# Patient Record
Sex: Male | Born: 1950 | Race: Black or African American | Hispanic: No | Marital: Married
Health system: Southern US, Community
[De-identification: ages and names within clinical notes are randomized; demographics above are authoritative.]

---

## 2012-03-26 ENCOUNTER — Inpatient Hospital Stay: Payer: Self-pay | Admitting: Internal Medicine

## 2012-03-26 LAB — COMPREHENSIVE METABOLIC PANEL
Albumin: 2.4 g/dL — ABNORMAL LOW (ref 3.4–5.0)
Alkaline Phosphatase: 193 U/L — ABNORMAL HIGH (ref 50–136)
BUN: 12 mg/dL (ref 7–18)
Bilirubin,Total: 1.6 mg/dL — ABNORMAL HIGH (ref 0.2–1.0)
Co2: 20 mmol/L — ABNORMAL LOW (ref 21–32)
Creatinine: 0.62 mg/dL (ref 0.60–1.30)
EGFR (Non-African Amer.): >60
Osmolality: 272 (ref 275–301)
SGPT (ALT): 86 U/L — ABNORMAL HIGH (ref 12–78)
Sodium: 135 mmol/L — ABNORMAL LOW (ref 136–145)
Total Protein: 5.6 g/dL — ABNORMAL LOW (ref 6.4–8.2)

## 2012-03-26 LAB — CBC
HCT: 30.5 % — ABNORMAL LOW (ref 40.0–52.0)
MCH: 32 pg (ref 26.0–34.0)
MCHC: 31.9 g/dL — ABNORMAL LOW (ref 32.0–36.0)
MCV: 101 fL — ABNORMAL HIGH (ref 80–100)
RBC: 3.03 10*6/uL — ABNORMAL LOW (ref 4.40–5.90)
RDW: 19.2 % — ABNORMAL HIGH (ref 11.5–14.5)
WBC: 10.1 10*3/uL (ref 3.8–10.6)

## 2012-03-26 LAB — PROTIME-INR
INR: 1
Prothrombin Time: 13.5 secs (ref 11.5–14.7)

## 2012-03-26 LAB — LIPASE, BLOOD: Lipase: 47 U/L — ABNORMAL LOW (ref 73–393)

## 2012-03-26 LAB — TROPONIN I: Troponin-I: <0.02 ng/mL

## 2012-03-27 LAB — COMPREHENSIVE METABOLIC PANEL
Albumin: 2 g/dL — ABNORMAL LOW (ref 3.4–5.0)
Alkaline Phosphatase: 159 U/L — ABNORMAL HIGH (ref 50–136)
BUN: 12 mg/dL (ref 7–18)
Bilirubin,Total: 2 mg/dL — ABNORMAL HIGH (ref 0.2–1.0)
Calcium, Total: 7.2 mg/dL — ABNORMAL LOW (ref 8.5–10.1)
Co2: 25 mmol/L (ref 21–32)
Creatinine: 0.7 mg/dL (ref 0.60–1.30)
EGFR (Non-African Amer.): >60
Glucose: 124 mg/dL — ABNORMAL HIGH (ref 65–99)
Osmolality: 281 (ref 275–301)
Potassium: 3.6 mmol/L (ref 3.5–5.1)
SGPT (ALT): 66 U/L (ref 12–78)
Sodium: 140 mmol/L (ref 136–145)
Total Protein: 4.6 g/dL — ABNORMAL LOW (ref 6.4–8.2)

## 2012-03-27 LAB — CBC WITH DIFFERENTIAL/PLATELET
Basophil #: 0 10*3/uL (ref 0.0–0.1)
Eosinophil #: 0.1 10*3/uL (ref 0.0–0.7)
Eosinophil %: 1.3 %
Lymphocyte #: 1 10*3/uL (ref 1.0–3.6)
MCH: 32 pg (ref 26.0–34.0)
MCHC: 31.8 g/dL — ABNORMAL LOW (ref 32.0–36.0)
MCV: 101 fL — ABNORMAL HIGH (ref 80–100)
Monocyte #: 0.8 x10 3/mm (ref 0.2–1.0)
Monocyte %: 8.8 %
Platelet: 92 10*3/uL — ABNORMAL LOW (ref 150–440)
RBC: 2.53 10*6/uL — ABNORMAL LOW (ref 4.40–5.90)
RDW: 19.2 % — ABNORMAL HIGH (ref 11.5–14.5)

## 2012-03-27 LAB — HEMATOCRIT: HCT: 24.4 % — ABNORMAL LOW (ref 40.0–52.0)

## 2012-03-27 LAB — HEMOGLOBIN: HGB: 7.8 g/dL — ABNORMAL LOW (ref 13.0–18.0)

## 2012-03-28 LAB — FOLATE: Folic Acid: 6.5 ng/mL (ref 3.1–100.0)

## 2012-03-28 LAB — HEMOGLOBIN: HGB: 9.3 g/dL — ABNORMAL LOW (ref 13.0–18.0)

## 2014-02-02 IMAGING — CT CT ABD-PELV W/ CM
1 of 2 series · 15 of 32 positions shown, 19 images · IV contrast (isovue)
Comparison: None

REASON FOR EXAM: (1) gi bleeding; (2) abd pel pain
COMMENTS:

PROCEDURE:     CT  - CT ABDOMEN / PELVIS  W  - March 26, 2012  [DATE]
RESULT:     History: GI bleed
TECHNIQUE: Multiple axial images of the abdomen and pelvis were performed
from the lung bases to the pubic symphysis, without p.o. contrast and with
100 ml of Isovue 370 intravenous contrast.

[Series 2: 3mm soft tissue · axial · 0.79mm/px · z∈[-1238,-772]mm · 15 of 171 slices shown, 19 images]
[im 8/171  soft-tissue]
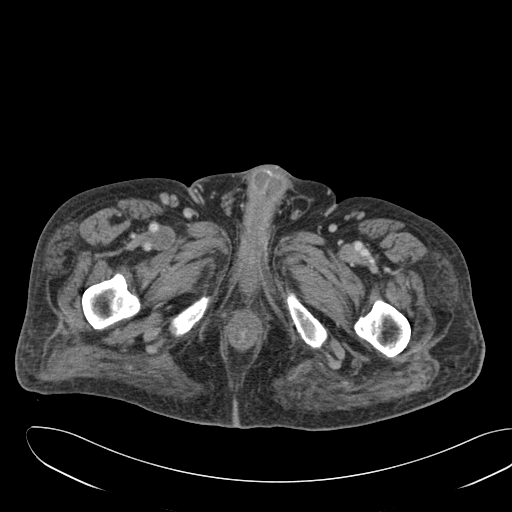
[im 8/171  bone]
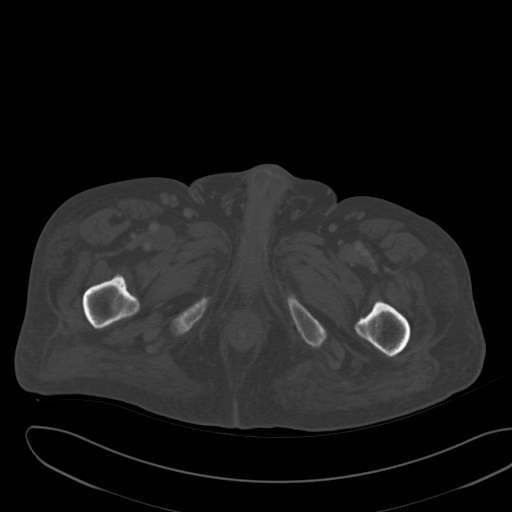
[im 23/171  soft-tissue]
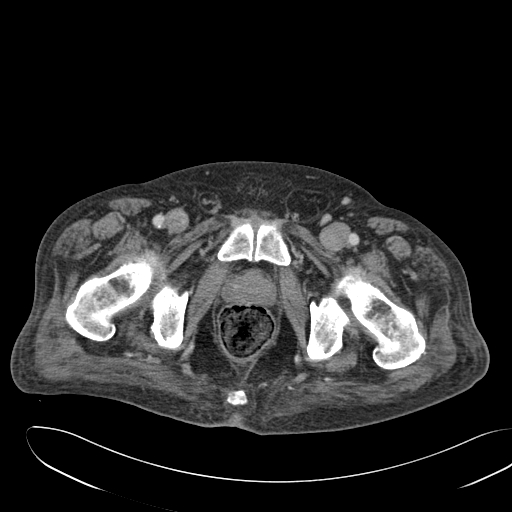
[im 37/171  soft-tissue]
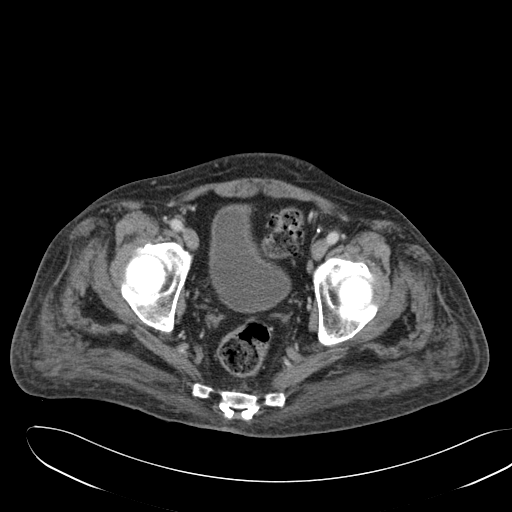
[im 45/171  soft-tissue]
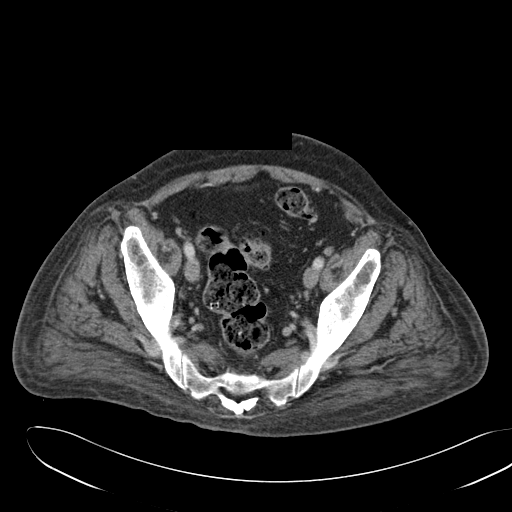
[im 60/171  soft-tissue]
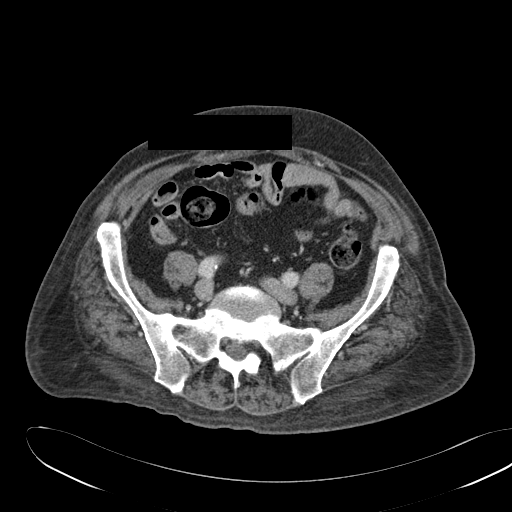
[im 74/171  soft-tissue]
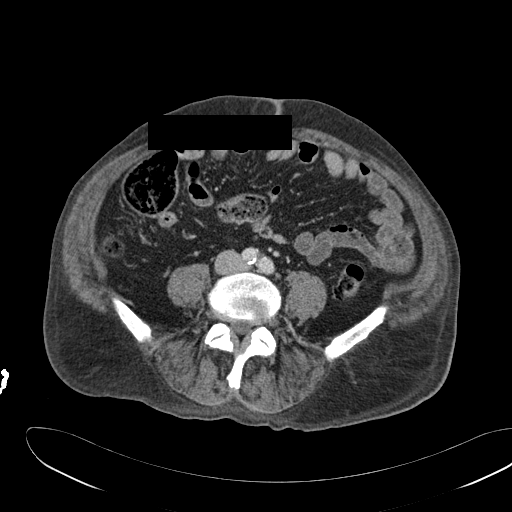
[im 89/171  soft-tissue]
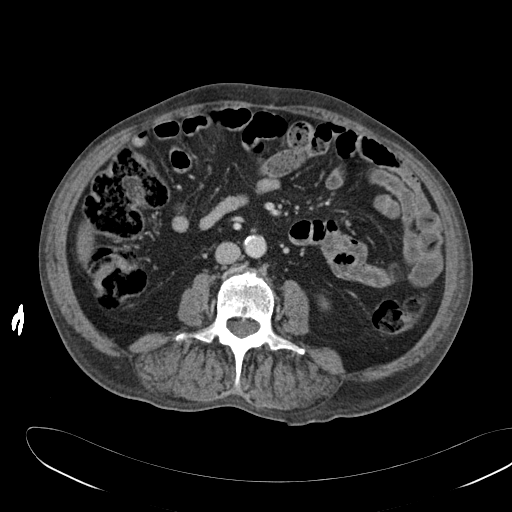
[im 97/171  soft-tissue]
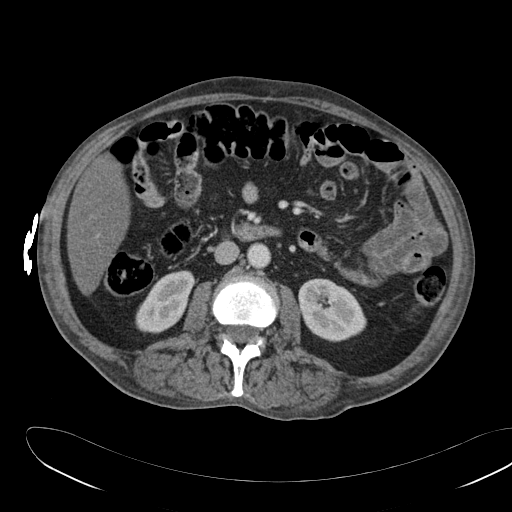
[im 111/171  soft-tissue]
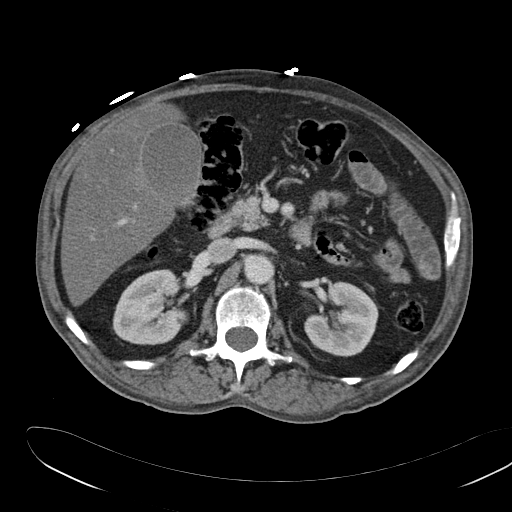
[im 111/171  bone]
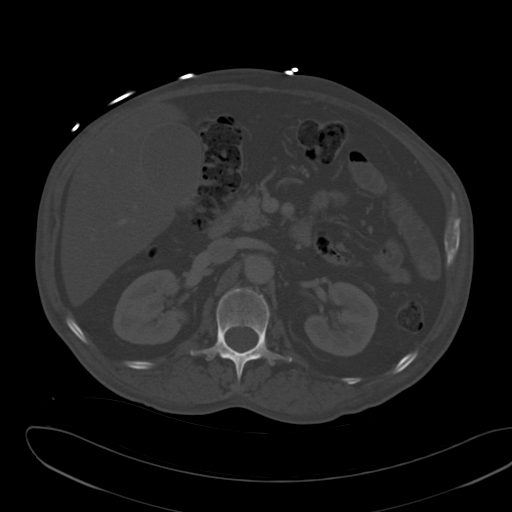
[im 126/171  soft-tissue]
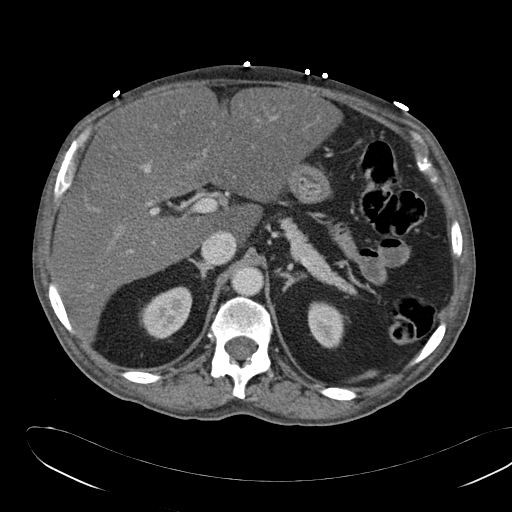
[im 134/171  soft-tissue]
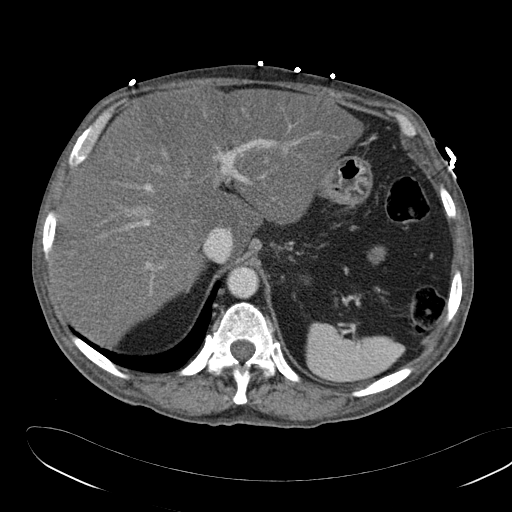
[im 141/171  lung]
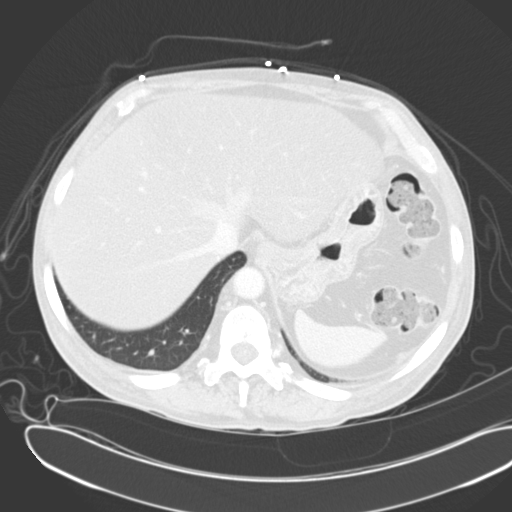
[im 148/171  soft-tissue]
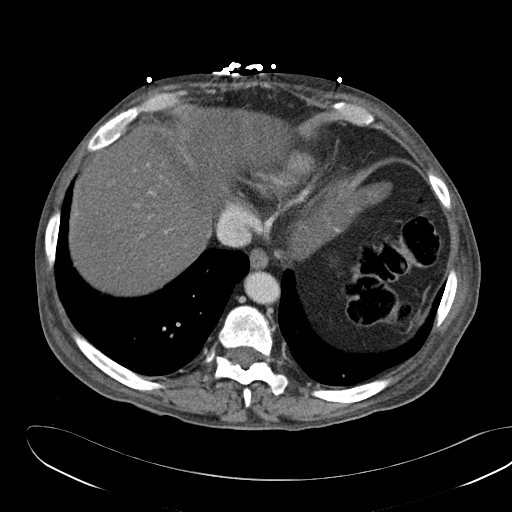
[im 148/171  lung]
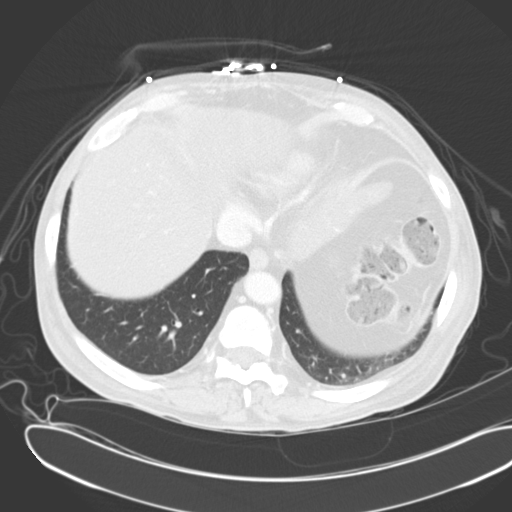
[im 156/171  lung]
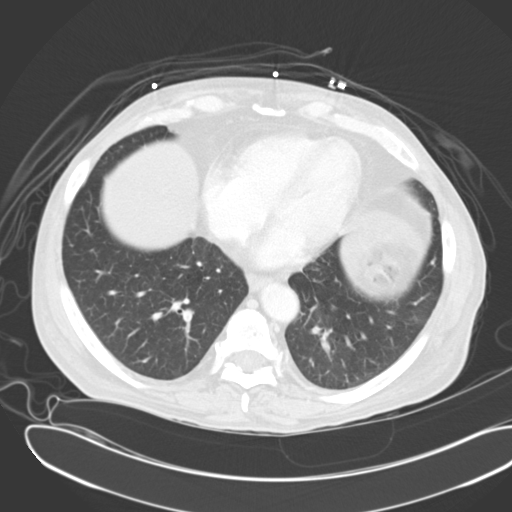
[im 163/171  soft-tissue]
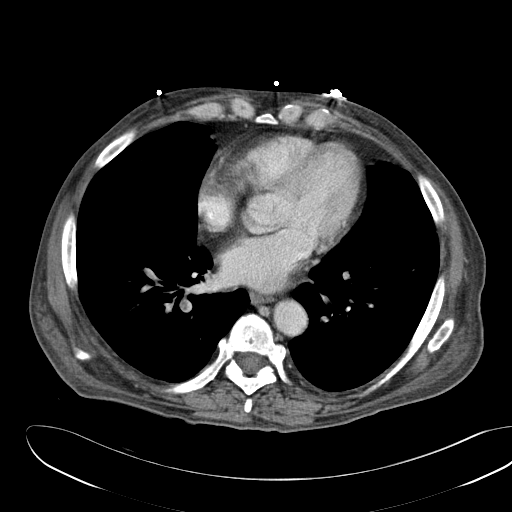
[im 163/171  lung]
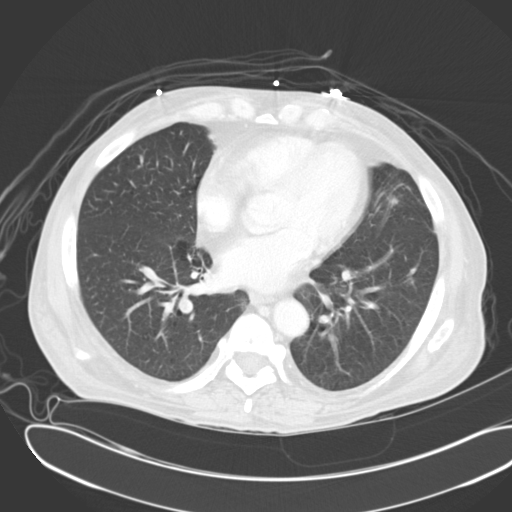

[15 of 32 positions shown; findings below may reference images not displayed]

FINDINGS: The lung bases are clear. There is no pneumothorax. The heart size is
normal.

The liver is diffusely low in attenuation as can be seen with hepatic
steatosis. There is no intrahepatic or extrahepatic biliary ductal
dilatation. The gallbladder is unremarkable. The spleen demonstrates no
focal abnormality. The kidneys, adrenal glands, and pancreas are normal. The
bladder is unremarkable.

The stomach, duodenum, small intestine, and large intestine demonstrate no
gross abnormality. There is diverticulosis without evidence of
diverticulitis. There is a fat-containing periumbilical hernia. There is no
pneumoperitoneum, pneumatosis, or portal venous gas. There is no abdominal
or pelvic free fluid. There is no lymphadenopathy.

The abdominal aorta is normal in caliber with atherosclerosis.

The osseous structures are unremarkable.
IMPRESSION: 1. No acute abdominal or pelvic pathology.
2. Diverticulosis.

[REDACTED]

## 2014-07-23 NOTE — Discharge Summary (Signed)
PATIENT NAME:  Darren Hale, Darren Hale MR#:  [id number] DATE OF BIRTH:  08-18-1950  DATE OF ADMISSION:  03/26/2012 DATE OF DISCHARGE:  03/28/2012  PRIMARY CARE PHYSICIAN: Dr. Yetta BarreJones  CONSULTATION:  Dr. Mechele CollinElliott and Dr. Bluford Kaufmannh.    FINAL DIAGNOSES: 1.  Acute blood loss anemia with lower gastrointestinal bleeding, possibly due to diverticulosis.  2.  Transaminitis.  3.  Alcohol abuse.  4.  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  5.  Hypertension.  6.  Ataxia.   CONDITION: Stable.   CODE STATUS: Full code.   MEDICATIONS:  Protonix 40 mg p.o. b.i.d. Lasix 20 mg p.o. b.i.d.   DIET: Low sodium, low fat, low cholesterol diet.   ACTIVITY: As tolerated.   FOLLOWUP CARE: Follow up with Dr. Mechele CollinElliott within 1 week, and also follow up with  PCP within 1 to 2 weeks. The patient needs alcohol cessation and home health with PT.   REASON FOR ADMISSION: Bright red blood per rectum, 1 episode.   HOSPITAL COURSE: The patient is a 64 year old African American male with a history of hypertension, not on any hypertensive medication, presented in the ED with 1 episode of bright red blood per rectum. The patient had a colonoscopy 2 years ago which was normal. In the ED patient was noted to have some somewhat hypotensive with blood pressure in 90s. BUN 12, creatinine 0.6. Hemoglobin 9.7, platelets 111. AST 228, ALT 86. The patient was admitted for GI bleeding. After admission, the patient has been treated with Protonix IV push and IV fluid support. Since patient's hemoglobin decreased from 9.7 to 7.8, the patient got 1 unit of blood transfusion. Dr. Mechele CollinElliott evaluated the patient and suggested no further workup.  He suggested the patient has alcoholic cirrhosis with low platelets, should get a B12 and folic acid level which is normal. The patient to get a CAT scan since the patient has some imbalance problems with ataxia. The patient got a CAT scan of head which is normal. Dr. Mechele CollinElliott suggested the patient's rectal bleeding  is possibly due to diverticulosis or internal hemorrhoid. He suggested to follow  him as an outpatient. After 1 unit PRBC transfusion, the patient's hemoglobin increased to 9.7. The patient has no active bleeding after admission. Dr. Bluford Kaufmannh, who covered Dr. Mechele CollinElliott today, suggested the patient may be discharged and follow up with Dr. Mechele CollinElliott as outpatient for liver issue.  The patient has no complaints. Physical therapy evaluation suggested home health with home PT. The patient is clinically stable and will be discharged to home with help and PT today. I discussed the patient's situation and discussed the patient's discharge plan with the patient, the patient's wife and the case manager.   TIME SPENT: About 38 minutes.     ____________________________ Shaune PollackQing Karess Harner, MD qc:cs D: 03/28/2012 15:14:00 ET T: 03/28/2012 15:52:58 ET JOB#: [id number]  cc: Shaune PollackQing Sahid Borba, MD, <Dictator> el  Shaune PollackQING Sherice Ijames MD ELECTRONICALLY SIGNED 03/29/2012 16:15

## 2014-07-23 NOTE — Consult Note (Signed)
Pt seen, interviewed and examined.  I talked to his wife in private about his alcohol consumption.  She is a long time patient of mine.  She says he is a very heavy drinker for many years and is usually drunk by noon each day.  Gin is what he drinks, usually straight.  His labs suggest alcohol cirrhosis with low platelet ct.  He has had poor balance recently with falling.  suggest neurology consult, B 12 level, folic acid level, consider head CT or MRI.  Likely he bled from diverticulosis or internal hemorrhoids.  Will follow with you.  DO NOT tell patient his wife told about his level of drinking.  Electronic Signatures: Scot JunElliott, Robert T (MD)  (Signed on 23-Dec-13 13:03)  Authored  Last Updated: 23-Dec-13 13:03 by Scot JunElliott, Robert T (MD)

## 2014-07-23 NOTE — Consult Note (Signed)
PATIENT NAME:  Darren Hale, [name] MR#:  [id number] DATE OF BIRTH:  Sep 13, 1950  DATE OF CONSULTATION:  03/27/2012  CONSULTING PHYSICIAN:  Scot Junobert T. Viola Placeres, MD  HISTORY OF PRESENT ILLNESS: The patient is a 64 year old black male who was admitted with lower GI bleeding. I was asked to see him in consultation for this.   This is the first time he has had significant lower GI bleeding. He was watching a ball game and noticed bright red blood on the couch. He had bright blood twice, came to the ER, and was admitted to the hospital. He has not had bleeding since he was admitted.   PAST MEDICAL HISTORY: Hypertension, elevated cholesterol and triglycerides.   PAST SURGICAL HISTORY: Appendectomy.   ALLERGIES: No known drug allergies.   FAMILY HISTORY: Mother alive at age 64. Father died of Alzheimer's.  REVIEW OF SYSTEMS: No chest pains. No asthma, wheezing, emphysema, chronic cough, shortness of breath. No dysphagia. No abdominal pain. No previous bleeding. No dysuria or hematuria. He has had kidney stones in the past. His last colonoscopy was at [facility] in the last two years, apparently showed no abnormalities. He denies headaches, mouth sores, or dysphagia. No nausea or vomiting.  MEDICATIONS: Simvastatin 80 mg a day, atorvastatin, and metoprolol, none of which he currently takes.   SOCIAL HISTORY: The patient says that he is usually drunk by noon. His wife says he is a very, very heavy drinker for many years. His drink of choice is straight gin.  He smokes cigarettes less than 1/2 pack a day.   The patient has had falling spells recently in the last few weeks and has had great difficulty with walking because of this.   PHYSICAL EXAMINATION:  GENERAL: Black male lying in bed in no acute distress, looks older than stated age.  VITAL SIGNS: Temp 98.1, pulse 85, blood pressure 106/70, respirations 20, and O2 sat 92% on room air.  HEENT: Sclerae anicteric. Conjunctivae negative. Tongue is  negative. The head is atraumatic.  CHEST: Clear. Shows a few faint crackles in the left base.  HEART: A I/VI systolic murmur.  ABDOMEN: Mildly palpable enlarged right lobe of the liver, minimal tenderness. No splenomegaly palpable. Abdomen otherwise is not tender.  EXTREMITIES: 1 to 2+ edema  NEUROLOGIC: No flap. No asterixis. No difficulty in finger-to-nose movement.   LABORATORY DATA: Sodium 135, potassium 3.1, chloride 97, bicarb 20, calcium 7.7, lipase 47, albumin 2.4, total bili 1.6, alkaline phosphatase 193, AST 228, ALT 86. Hemoglobin 9.7, platelet count 111, white count 10,000. CT abdomen and pelvis showed no acute abnormality and diverticulosis.   ASSESSMENT:  1. Diverticular bleeding which appears to have stopped. Would keep on water or clear liquids for a day or two.  2. Very heavy alcohol use for several years, now with a history of difficulty in ambulation and falling. Suggest a neurology consultation.  3. Elevated liver functions with low platelet count including a low albumin of 2.4 very likely indicating cirrhosis of the liver secondary to alcoholism.  In the absence of further bleeding, no intervention planned at this time. We will try to pull his records from [facility] two years ago. Will follow with you.     ____________________________ Scot Junobert T. Lumina Gitto, MD rte:es D: 03/27/2012 13:12:02 ET T: 03/27/2012 14:22:05 ET JOB#: [id number]  cc: Scot Junobert T. Enrika Aguado, MD, <Dictator> Thad Rangerai Ripudeep, MD  Shaune PollackQing Chen, MD Scot JunOBERT T Welby Montminy MD ELECTRONICALLY SIGNED 03/30/2012 11:53

## 2014-07-23 NOTE — Consult Note (Signed)
Chief Complaint:   Subjective/Chief Complaint Covering for Dr. Mechele CollinElliott. Feels fine now. No further bleeding. No abd pain or cramping. Wants to go home.   VITAL SIGNS/ANCILLARY NOTES: **Vital Signs.:   24-Dec-13 05:08   Vital Signs Type Routine   Temperature Temperature (F) 98.8   Celsius 37.1   Temperature Source Oral   Pulse Pulse 86   Respirations Respirations 20   Systolic BP Systolic BP 116   Diastolic BP (mmHg) Diastolic BP (mmHg) 69   Mean BP 84   Pulse Ox % Pulse Ox % 90   Pulse Ox Activity Level  At rest   Oxygen Delivery Room Air/ 21 %   Brief Assessment:   Cardiac Regular    Respiratory clear BS    Gastrointestinal Normal   Lab Results: Routine Chem:  24-Dec-13 04:12    Folic Acid, Serum 6.5 (Result(s) reported on 28 Mar 2012 at 05:49AM.)  Routine Hem:  24-Dec-13 04:12    Hemoglobin (CBC)  9.3 (Result(s) reported on 28 Mar 2012 at 05:26AM.)   Assessment/Plan:  Assessment/Plan:   Assessment LGI bleeding. Prob diverticular. Bleeding stopped.    Plan Try low residue diet. If no further bleeding, then patient can be discharged soon with f/u with Dr. Mechele CollinElliott in office to address liver issue.   Electronic Signatures: Lutricia Feilh, Alondra Sahni (MD)  (Signed 24-Dec-13 09:20)  Authored: Chief Complaint, VITAL SIGNS/ANCILLARY NOTES, Brief Assessment, Lab Results, Assessment/Plan   Last Updated: 24-Dec-13 09:20 by Lutricia Feilh, Kayleb Warshaw (MD)

## 2014-07-23 NOTE — Consult Note (Signed)
I will be gone for two days.  Dr. Bluford Kaufmannh will see the patient while I am gone.    Electronic Signatures: Scot JunElliott, Kenyotta Dorfman T (MD)  (Signed on 23-Dec-13 14:38)  Authored  Last Updated: 23-Dec-13 14:38 by Scot JunElliott, Reola Buckles T (MD)

## 2014-07-26 NOTE — H&P (Signed)
PATIENT NAME:  Darren Hale, [name] MR#:  [id number] DATE OF BIRTH:  21-Jul-1950  DATE OF ADMISSION:  03/26/2012  PRIMARY CARE PHYSICIAN: Dr. Arlington CalixSingla.   CHIEF COMPLAINT: Bright red blood per rectum, 1 episode today while watching TV.   HISTORY OF PRESENT ILLNESS: The patient is a 64 year old African American male with no significant past medical history except hypertension, not on any antihypertensives. Presented to the ED with 1 episode of bright red blood per rectum today. History was obtained from the patient and his wife present in the room. The patient reported that he was watching the football game around 3:30 p.m. When he was sitting on the couch, he noticed a large amount of blood on the couch. He reported it as bright red blood with rectal bleeding but had no nausea, vomiting, abdominal pain, fevers or chills. He denied any prior history of GI bleed. Per the patient, he had a colonoscopy done 2 years ago which according to him was normal. He denies being on any blood thinners, aspirin or Plavix. He is not on any PPI. In the Emergency Room, the patient was noted to be somewhat hypotensive with systolic blood pressure in the 90s. BUN of 12. Creatinine was 0.6. Hemoglobin of 9.7 with a hematocrit of 30.5. Platelets 111. AST 228, ALT 86. Hospitalist service was requested for admission for GI bleed.    PAST MEDICAL HISTORY: Hypertension, not on any antihypertensives.   PAST SURGICAL HISTORY: Appendectomy.   ALLERGIES: No known allergies.   MEDICATIONS PRIOR TO ADMISSION: None.   REVIEW OF SYSTEMS:  CONSTITUTIONAL: The patient denies any fever, any fatigue or any pain. He denies any recent weight loss or weight gain.  HEENT: The patient denies any visual issues, any eye pain or redness. He denies any ear pain, hearing loss or any tinnitus or ear discharge. He denies any difficulty swallowing.  RESPIRATORY: The patient denies any chest pain or wheezing or any dyspnea. He denies any hemoptysis.   CARDIOVASCULAR: The patient denies any chest pain, dizziness or any syncopal episode.  GASTROINTESTINAL: Please see HPI.   GENITOURINARY: The patient denies any dysuria or hematuria.  ENDOCRINE: The patient denied any nocturia or polyuria or heat or cold intolerance.  HEMATOLOGY: He denied any bleeding or clotting disorder personally or in his family.  MUSCULOSKELETAL: He denies any arthralgias or joint swelling or pain.  NEUROLOGIC: The patient denies any numbness or tingling or dysarthria or weakness.  PSYCHIATRIC: The patient denies any nervousness or anxiety.   SOCIAL HISTORY: The patient states that he smokes seldom off and on. Alcohol: He states that he drinks only off and on and had 2 beers today. He denies any use of recreational drugs. He ambulates without any assistance.   PHYSICAL EXAMINATION:  VITAL SIGNS: Temperature 99.3, pulse 85, blood pressure 98/68, pulse ox 95% on 2 liters.  GENERAL: The patient is alert, awake and oriented x3, not in any acute distress.  HEENT:atraumatic, anicteric sclera, Pupils reactive to light and accommodation. EOMI.  NECK: Supple. No lymphadenopathy. No JVD.  CARDIOVASCULAR SYSTEM: S1, S2, clear. No murmurs, rubs or gallops.  CHEST: Clear to auscultation bilaterally.  ABDOMEN: Soft, nontender, nondistended. Normal bowel sounds.  EXTREMITIES: No cyanosis, clubbing or edema noted in upper or lower extremities bilaterally.  NEUROLOGIC: No focal neurological deficits noted. Cranial nerves II through XII are intact.  SKIN: Dry. No rashes or any lesions noted.   LABS AND DIAGNOSTIC DATA: Sodium 135, potassium 3.1, chloride 97, bicarb 20, calcium 7.7, lipase  47, total protein 5.6, serum albumin 2.4, total bili 1.6, alkaline phosphatase 193, AST 228, ALT 86. Troponin less than 0.02. Hemoglobin 9.7, hematocrit 30.5, platelets 111, WBC 10.1, MCV 101. CT abdomen and pelvis was done in the Emergency Room which showed:  1. No acute abdominal or pelvic  pathology.  2. Diverticulosis.   ASSESSMENT AND PLAN: Briefly, the patient is a 64 year old male with no significant past medical history except hypertension, not on any antihypertensives. Presenting with acute blood loss anemia/macrocytic anemia secondary to lower gastrointestinal bleed.  1. Acute blood loss anemia with lower gastrointestinal bleed: Baseline hemoglobin unknown. The patient will be admitted to telemetry unit. Currently, his blood pressure is soft. Will place him on intravenous fluid bolus and subsequently continue aggressive intravenous fluid hydration. Will obtain hemoglobin q.6 hours and keep the patient n.p.o. Gastroenterology consult has been obtained and discussed with Dr. Markham Jordan who will evaluate the patient in the a.m. Will transfuse the patient if hemoglobin less than 8.0. Will obtain tagged RBC scan if he starts bleeding profusely again. The patient likely has diverticular painless rectal bleeding. Colonoscopy was done 2 years ago according to the patient and was normal. Will place on intravenous proton pump inhibitor.  2. Hypokalemia: Replace with intravenous fluids.  3. Transaminitis: Appears to be alcoholic pattern with macrocytic anemia, thrombocytopenia, elevated AST. CT abdomen was done in the Emergency Room which showed diverticulosis. Will recheck LFTs in a.m.  4. Alcohol abuse: The patient will be placed on CIWA scale with Ativan.  5. Deep venous thrombosis prophylaxis: Place on sequential compression devices.   CODE STATUS: FULL CODE.      ____________________________ Thad Ranger, MD rr:gb D: 03/26/2012 23:15:17 ET T: 03/27/2012 02:00:49 ET JOB#: [id number]  cc: Thad Ranger, MD, <Dictator> Dr. Candelaria Stagers RAI MD ELECTRONICALLY SIGNED 06/17/2012 20:03
